# Patient Record
Sex: Male | Born: 1980 | Hispanic: Yes | Marital: Married | Smoking: Never smoker
Health system: Southern US, Community
[De-identification: ages and names within clinical notes are randomized; demographics above are authoritative.]

## PROBLEM LIST (undated history)

## (undated) ENCOUNTER — Emergency Department (HOSPITAL_COMMUNITY): Disposition: A | Discharge status: Home / Self Care | Payer: Self-pay

## (undated) DIAGNOSIS — N200 Calculus of kidney: Secondary | ICD-10-CM

---

## 2016-04-09 ENCOUNTER — Encounter (HOSPITAL_COMMUNITY): Payer: Self-pay | Admitting: Emergency Medicine

## 2016-04-09 ENCOUNTER — Emergency Department (HOSPITAL_COMMUNITY)
Admission: EM | Admit: 2016-04-09 | Discharge: 2016-04-09 | Disposition: A | Discharge status: Home / Self Care | Payer: Worker's Compensation | Attending: Emergency Medicine | Admitting: Emergency Medicine

## 2016-04-09 DIAGNOSIS — Y939 Activity, unspecified: Secondary | ICD-10-CM | POA: Insufficient documentation

## 2016-04-09 DIAGNOSIS — Y929 Unspecified place or not applicable: Secondary | ICD-10-CM | POA: Insufficient documentation

## 2016-04-09 DIAGNOSIS — Y990 Civilian activity done for income or pay: Secondary | ICD-10-CM | POA: Diagnosis not present

## 2016-04-09 DIAGNOSIS — W228XXA Striking against or struck by other objects, initial encounter: Secondary | ICD-10-CM | POA: Insufficient documentation

## 2016-04-09 DIAGNOSIS — S0990XA Unspecified injury of head, initial encounter: Secondary | ICD-10-CM | POA: Insufficient documentation

## 2016-04-09 MED ORDER — ACETAMINOPHEN 325 MG PO TABS
650.0000 mg | ORAL_TABLET | Freq: Once | ORAL | Status: AC
  Administered 2016-04-09: 650 mg via ORAL
  Filled 2016-04-09: qty 2

## 2016-04-09 NOTE — Discharge Instructions (Signed)
Read the information below.   You may have a mild concussion. I have provided information regarding concussion and management, please read.  You can take tylenol or motrin for pain relief.  Be sure to follow up with a primary care doctor, I have provided the contact information above.  You may return to the Emergency Department at any time for worsening condition or any new symptoms that concern you. Return to ED if you develop vomiting, changes in vision, confusion/agitation/lethargy, or any neurologic symptoms.

## 2016-04-09 NOTE — ED Provider Notes (Signed)
[facility]-EMERGENCY DEPT Provider Note   CSN: [id number] Arrival date & time: 04/09/16  1751   By signing my name below, I, Christopher Johnston, attest that this documentation has been prepared under the direction and in the presence of Christopher MeresAshley Meyer, PA-C. Electronically Signed: Christel MormonMatthew Johnston, Scribe. 04/09/2016. 7:53 PM.   History   Chief Complaint Chief Complaint  Patient presents with  . Head Injury    The history is provided by the patient. No language interpreter was used.   HPI Comments:  Christopher Johnston is a 35 y.o. male who presents to the Emergency Department s/p an injury to his head and face. Pt reports that he was lowering a 1916ft ladder when his co-worker let the ladder slip causing it to hit the pt on the head, slide down his face, and trap his arm on the steps. Pt states that he felt dizzy initially following the event, but the dizziness has resolved. Pt notes some moderate bruising on his L shoulder. Pt denies taking anticoagulates. No alleviating factors noted. Patient has not tried any treatment. Pt denies headache, lightheadedness, numbness, weakness, LOC, slurred speech, facial droop, neck pain, trouble swallowing, eye pain, changes in vision, chest pain, SOB, dysuria, hematuria, vomiting, and fever.   History reviewed. No pertinent past medical history.  There are no active problems to display for this patient.   History reviewed. No pertinent surgical history.     Home Medications    Prior to Admission medications   Not on File    Family History History reviewed. No pertinent family history.  Social History Social History  Substance Use Topics  . Smoking status: Never Smoker  . Smokeless tobacco: Never Used  . Alcohol use Yes     Comment: less than 1 week     Allergies   Review of patient's allergies indicates no known allergies.   Review of Systems Review of Systems  Constitutional: Negative for fever.  HENT: Negative for trouble swallowing.     Eyes: Negative for pain and visual disturbance.  Respiratory: Negative for shortness of breath.   Cardiovascular: Negative for chest pain.  Gastrointestinal: Negative for abdominal pain and vomiting.  Genitourinary: Negative for dysuria and hematuria.  Musculoskeletal: Negative for arthralgias, myalgias and neck pain.  Skin: Negative for rash.  Neurological: Positive for dizziness ( resolved) and headaches ( resolved). Negative for syncope, facial asymmetry, speech difficulty, weakness, light-headedness and numbness.     Physical Exam Updated Vital Signs BP 137/79 (BP Location: Right Arm)   Pulse 74   Temp 98.9 F (37.2 C) (Oral)   Resp 20   Ht 5\' 5"  (1.651 m)   Wt 102.1 kg   SpO2 99%   BMI 37.44 kg/m   Physical Exam  Constitutional: He appears well-developed and well-nourished. No distress.  HENT:  Head: Normocephalic and atraumatic. Head is without raccoon's eyes and without Battle's sign.  Right Ear: No hemotympanum.  Left Ear: No hemotympanum.  Mouth/Throat: Oropharynx is clear and moist. No oropharyngeal exudate.  Eyes: Conjunctivae and EOM are normal. Pupils are equal, round, and reactive to light. Right eye exhibits no discharge. Left eye exhibits no discharge. No scleral icterus.  Neck: Normal range of motion and phonation normal. Neck supple. No neck rigidity. Normal range of motion present.  Cardiovascular: Normal rate, regular rhythm, normal heart sounds and intact distal pulses.   No murmur heard. Pulmonary/Chest: Effort normal and breath sounds normal. No stridor. No respiratory distress. He has no wheezes. He has no rales.  Abdominal: Soft. Bowel sounds are normal. He exhibits no distension. There is no tenderness. There is no rigidity, no rebound, no guarding and no CVA tenderness.  Musculoskeletal: Normal range of motion.  Lymphadenopathy:    He has no cervical adenopathy.  Neurological: He is alert. He is not disoriented. Coordination and gait normal. GCS  eye subscore is 4. GCS verbal subscore is 5. GCS motor subscore is 6.  Mental Status:  Alert, thought content appropriate, able to give a coherent history. Speech fluent without evidence of aphasia. Able to follow 2 step commands without difficulty.  Cranial Nerves:  II:  Peripheral visual fields grossly normal, pupils equal, round, reactive to light III,IV, VI: ptosis not present, extra-ocular motions intact bilaterally  V,VII: smile symmetric, facial light touch sensation equal VIII: hearing grossly normal to voice  X: uvula elevates symmetrically  XI: bilateral shoulder shrug symmetric and strong XII: midline tongue extension without fassiculations Motor:  Normal tone. 5/5 in upper and lower extremities bilaterally including strong and equal grip strength and dorsiflexion/plantar flexion Sensory: light touch normal in all extremities. Cerebellar: normal finger-to-nose with bilateral upper extremities Gait: normal gait and balance CV: distal pulses palpable throughout   Skin: Skin is warm and dry. He is not diaphoretic.  Psychiatric: He has a normal mood and affect. His behavior is normal.  Nursing note and vitals reviewed.    ED Treatments / Results  DIAGNOSTIC STUDIES:  Oxygen Saturation is 99% on RA, normal by my interpretation.    COORDINATION OF CARE:  7:53 PM Discussed treatment plan with pt at bedside and pt agreed to plan.  Labs (all labs ordered are listed, but only abnormal results are displayed) Labs Reviewed - No data to display  EKG  EKG Interpretation None       Radiology No results found.  Procedures Procedures (including critical care time)  Medications Ordered in ED Medications  acetaminophen (TYLENOL) tablet 650 mg (650 mg Oral Given 04/09/16 2010)     Initial Impression / Assessment and Plan / ED Course  I have reviewed the triage vital signs and the nursing notes.  Pertinent labs & imaging results that were available during my care of the  patient were reviewed by me and considered in my medical decision making (see chart for details).  Clinical Course   Patient presents to ED complaint of head injury. Patient is afebrile and non-toxic appearing in NAD. VSS. Physical exam is re-assuring. No battle sign, raccoon eyes, or hemotympanum. No neurologic deficits. Based on [place] CT rule do not feel imaging is warranted at this time - low suspicion for skull fracture or closed head injury. Possible minor concussion. Discussed plan with patient. Symptomatic management to include cognitive rest and pain medicine. Strict return precautions discussed. Patient voiced understanding and is agreeable.     Final Clinical Impressions(s) / ED Diagnoses   Final diagnoses:  Minor head injury, initial encounter    New Prescriptions New Prescriptions   No medications on file  I personally performed the services described in this documentation, which was scribed in my presence. The recorded information has been reviewed and is accurate.     Lona Kettle, PA-C 04/09/16 2015    Doug Sou, MD 04/10/16 [phone]

## 2016-04-09 NOTE — ED Triage Notes (Signed)
Pt reports he was lowering a 2116ft ladder at work with a co-worker when the co-worker let part of the ladder slip. The ladder hit him in the head/face. Pt reports minor throbbing pain at this time 2/10.

## 2019-12-04 ENCOUNTER — Encounter (HOSPITAL_COMMUNITY): Payer: Self-pay | Admitting: *Deleted

## 2019-12-04 ENCOUNTER — Emergency Department (HOSPITAL_COMMUNITY): Payer: Self-pay

## 2019-12-04 ENCOUNTER — Other Ambulatory Visit: Payer: Self-pay

## 2019-12-04 ENCOUNTER — Emergency Department (HOSPITAL_COMMUNITY)
Admission: EM | Admit: 2019-12-04 | Discharge: 2019-12-05 | Disposition: A | Discharge status: Home / Self Care | Payer: Self-pay | Attending: Emergency Medicine | Admitting: Emergency Medicine

## 2019-12-04 DIAGNOSIS — N23 Unspecified renal colic: Secondary | ICD-10-CM | POA: Insufficient documentation

## 2019-12-04 HISTORY — DX: Calculus of kidney: N20.0

## 2019-12-04 LAB — BASIC METABOLIC PANEL
Anion gap: 9 (ref 5–15)
BUN: 16 mg/dL (ref 6–20)
CO2: 26 mmol/L (ref 22–32)
Calcium: 9.2 mg/dL (ref 8.9–10.3)
Chloride: 105 mmol/L (ref 98–111)
Creatinine, Ser: 1.12 mg/dL (ref 0.61–1.24)
GFR calc Af Amer: >60 mL/min (ref >60)
GFR calc non Af Amer: >60 mL/min (ref >60)
Glucose, Bld: 144 mg/dL — ABNORMAL HIGH (ref 70–99)
Potassium: 3.7 mmol/L (ref 3.5–5.1)
Sodium: 140 mmol/L (ref 135–145)

## 2019-12-04 LAB — CBC
HCT: 46.2 % (ref 39.0–52.0)
Hemoglobin: 15.6 g/dL (ref 13.0–17.0)
MCH: 28.9 pg (ref 26.0–34.0)
MCHC: 33.8 g/dL (ref 30.0–36.0)
MCV: 85.6 fL (ref 80.0–100.0)
Platelets: 301 10*3/uL (ref 150–400)
RBC: 5.4 MIL/uL (ref 4.22–5.81)
RDW: 13 % (ref 11.5–15.5)
WBC: 12.7 10*3/uL — ABNORMAL HIGH (ref 4.0–10.5)
nRBC: 0 % (ref 0.0–0.2)

## 2019-12-04 LAB — URINALYSIS, ROUTINE W REFLEX MICROSCOPIC
Bacteria, UA: NONE SEEN
Bilirubin Urine: NEGATIVE
Glucose, UA: NEGATIVE mg/dL
Ketones, ur: NEGATIVE mg/dL
Leukocytes,Ua: NEGATIVE
Nitrite: NEGATIVE
Protein, ur: 30 mg/dL — AB
RBC / HPF: >50 RBC/hpf — ABNORMAL HIGH (ref 0–5)
Specific Gravity, Urine: 1.02 (ref 1.005–1.030)
pH: 6 (ref 5.0–8.0)

## 2019-12-04 MED ORDER — ONDANSETRON HCL 4 MG/2ML IJ SOLN
4.0000 mg | Freq: Once | INTRAMUSCULAR | Status: AC
  Administered 2019-12-04: 23:00:00 4 mg via INTRAVENOUS
  Filled 2019-12-04: qty 2

## 2019-12-04 MED ORDER — SODIUM CHLORIDE 0.9 % IV BOLUS
1000.0000 mL | Freq: Once | INTRAVENOUS | Status: AC
  Administered 2019-12-04: 23:00:00 1000 mL via INTRAVENOUS

## 2019-12-04 MED ORDER — MORPHINE SULFATE (PF) 4 MG/ML IV SOLN
4.0000 mg | Freq: Once | INTRAVENOUS | Status: AC
  Administered 2019-12-04: 23:00:00 4 mg via INTRAVENOUS
  Filled 2019-12-04: qty 1

## 2019-12-04 NOTE — ED Triage Notes (Signed)
Left sided flank pain, vomiting, and blood in urine since around noon today. Hx of kidney stones, feels similar. Also, feels like he has to have a BM, but unable.

## 2019-12-04 NOTE — ED Provider Notes (Signed)
[facility] DEPT Provider Note   CSN: [id number] Arrival date & time: 12/04/19  2123     History Chief Complaint  Patient presents with  . Flank Pain   Christopher Johnston is a 39 y.o. male with a history of kidney stones who presents to the ED with complaints of flank pain that began around noon today. Patient states pain is located in the L flank, radiates into the L lower abdomen. Waxing/waning in severity, currently a 10/10 in severity without alleviating/aggravating factors. Has had associated nausea with 2 episodes of non bloody emesis as well as hematuria. Denies fever, chills, hematemesis, melena, hematochezia, dysuria, or testicular pain/swelling. Last BM was this afternoon and was normal, denies diarrhea, melena, or constipation despite triage note. He states this feels like prior kidney stones. Has not required prior surgical intervention for stones.   HPI     Past Medical History:  Diagnosis Date  . Kidney stones     There are no problems to display for this patient.   No past surgical history on file.     No family history on file.  Social History   Tobacco Use  . Smoking status: Never Smoker  . Smokeless tobacco: Never Used  Substance Use Topics  . Alcohol use: Yes    Comment: less than 1 week  . Drug use: No    Home Medications Prior to Admission medications   Not on File    Allergies    Patient has no known allergies.  Review of Systems   Review of Systems  Constitutional: Negative for chills and fever.  Respiratory: Negative for shortness of breath.   Cardiovascular: Negative for chest pain.  Gastrointestinal: Positive for abdominal pain, nausea and vomiting. Negative for anal bleeding, blood in stool, constipation and diarrhea.  Genitourinary: Positive for flank pain and hematuria. Negative for discharge, dysuria, scrotal swelling and testicular pain.  All other systems reviewed and are negative.   Physical  Exam Updated Vital Signs BP (!) 166/106 (BP Location: Right Arm)   Pulse 64   Temp 98.1 F (36.7 C) (Oral)   Resp 20   Ht 5\' 6"  (1.676 m)   Wt 101.6 kg   SpO2 99%   BMI 36.15 kg/m   Physical Exam Vitals and nursing note reviewed.  Constitutional:      General: He is in acute distress (mild, appears uncomfortable. ).     Appearance: He is well-developed. He is not toxic-appearing.  HENT:     Head: Normocephalic and atraumatic.  Eyes:     General:        Right eye: No discharge.        Left eye: No discharge.     Conjunctiva/sclera: Conjunctivae normal.  Cardiovascular:     Rate and Rhythm: Normal rate and regular rhythm.  Pulmonary:     Effort: Pulmonary effort is normal. No respiratory distress.     Breath sounds: Normal breath sounds. No wheezing, rhonchi or rales.  Abdominal:     General: There is no distension.     Palpations: Abdomen is soft.     Tenderness: There is abdominal tenderness (mild LLQ). There is left CVA tenderness. There is no right CVA tenderness, guarding or rebound.  Musculoskeletal:     Cervical back: Neck supple.  Skin:    General: Skin is warm and dry.     Findings: No rash.  Neurological:     Mental Status: He is alert.     Comments:  Clear speech.   Psychiatric:        Behavior: Behavior normal.    ED Results / Procedures / Treatments   Labs (all labs ordered are listed, but only abnormal results are displayed) Labs Reviewed  URINALYSIS, ROUTINE W REFLEX MICROSCOPIC - Abnormal; Notable for the following components:      Result Value   APPearance HAZY (*)    Hgb urine dipstick LARGE (*)    Protein, ur 30 (*)    RBC / HPF >50 (*)    All other components within normal limits  BASIC METABOLIC PANEL - Abnormal; Notable for the following components:   Glucose, Bld 144 (*)    All other components within normal limits  CBC - Abnormal; Notable for the following components:   WBC 12.7 (*)    All other components within normal limits     EKG None  Radiology US Renal  Result Date: 12/04/2019 CLINICAL DATA:  Left flank pain EXAM: RENAL / URINARY TRACT ULTRASOUND COMPLETE COMPARISON:  None. FINDINGS: Right Kidney: Renal measurements: 12.5 x 6.3 x 6.7 cm = volume: 273.7 mL . Echogenicity within normal limits. No mass or hydronephrosis visualized. Left Kidney: Renal measurements: 13 x 6 x 6.9 cm = volume: 282.6 mL. Cortical echogenicity normal. Mild dilatation of left renal pelvis without calyceal dilatation. Possible small stone measuring 5 mm in the mid pole. Bladder: Appears normal for degree of bladder distention. Other: None. IMPRESSION: 1. Mild left renal pelvis dilatation without frank hydronephrosis. Probable small stone in the left kidney. CT KUB follow-up as indicated. 2. Normal ultrasound appearance of the right kidney Electronically Signed   By: Jasmine Pang M.D.   On: 12/04/2019 23:15    Procedures Procedures (including critical care time)  Medications Ordered in ED Medications  sodium chloride 0.9 % bolus 1,000 mL (0 mLs Intravenous Stopped 12/04/19 2343)  ondansetron (ZOFRAN) injection 4 mg (4 mg Intravenous Given 12/04/19 2234)  morphine 4 MG/ML injection 4 mg (4 mg Intravenous Given 12/04/19 2234)    ED Course  I have reviewed the triage vital signs and the nursing notes.  Pertinent labs & imaging results that were available during my care of the patient were reviewed by me and considered in my medical decision making (see chart for details).    Christopher Johnston was evaluated in Emergency Department on 12/05/2019 for the symptoms described in the history of present illness. He/she was evaluated in the context of the global COVID-19 pandemic, which necessitated consideration that the patient might be at risk for infection with the SARS-CoV-2 virus that causes COVID-19. Institutional protocols and algorithms that pertain to the evaluation of patients at risk for COVID-19 are in a state of rapid change based on information  released by regulatory bodies including the CDC and federal and state organizations. These policies and algorithms were followed during the patient's care in the ED.  MDM Rules/Calculators/A&P                     Patient presents to the ED with complaints of L flank pain. Nontoxic, vitals WNL with the exception of elevated BP, low suspicion for HTN emergency. L CVA & LLQ abdominal tenderness noted. No peritoneal signs.   DDx: Nephrolithiasis, pyelonephritis, diverticulitis, constipation, obstruction, perf, pancreatitis, MSK, viral illness.   Additional history obtained:  Additional history obtained from patient's wife at bedside- confirms above. Previous records obtained and reviewed.   Lab Tests:  I Ordered, reviewed, and interpreted labs, which included:  CBC: Mild leukocytosis @ 12.7. No anemia.  BMP: Mild hyperglycemia. Renal function WNL. Electrolytes WNL.  Urinalysis: Hematuria, no UTI.  Imaging Studies ordered:  I ordered imaging studies which included renal ultrasound, I independently visualized and interpreted imaging which showed 1. Mild left renal pelvis dilatation without frank hydronephrosis. Probable small stone in the left kidney. CT KUB follow-up as indicated. 2. Normal ultrasound appearance of the right kidney ---> clinically suspect ureteral stone, patient w/ history of same and states this feels similar.   ED Course:  Patient appears uncomfortable on initial assessment. Morphine, zofran, & fluids ordered.   23:12: RE-EVAL: Patient resting comfortably, states pain is resolved at this time.   00:00: RE-EVAL: Patient remains pain free, tolerating PO, feels ready to go home   Suspect kidney stone as underlying etiology, he has not required surgical intervention with prior stones, renal function preserved, no infection on UA, pain controlled, do not feel emergent CT imaging is necessary at this time for further assessment. Repeat abdominal exam without peritoneal signs. Will  discharge home with symptomatic care and urology follow up. I discussed results, treatment plan, need for follow-up, and return precautions with the patient & his wife at bedside. Provided opportunity for questions, patient & is wife confirmed understanding and are in agreement with plan.   Portions of this note were generated with Scientist, clinical (histocompatibility and immunogenetics). Dictation errors may occur despite best attempts at proofreading.  Final Clinical Impression(s) / ED Diagnoses Final diagnoses:  Ureteral colic    Rx / DC Orders ED Discharge Orders         Ordered    ibuprofen (ADVIL) 600 MG tablet  Every 8 hours PRN     12/05/19 0005    oxyCODONE-acetaminophen (PERCOCET/ROXICET) 5-325 MG tablet  Every 6 hours PRN     12/05/19 0005    ondansetron (ZOFRAN ODT) 4 MG disintegrating tablet  Every 8 hours PRN     12/05/19 0005    tamsulosin (FLOMAX) 0.4 MG CAPS capsule  Daily after supper     12/05/19 0005           Christopher Johnston, Pleas Koch, PA-C 12/05/19 0008    Maia Plan, MD 12/05/19 1252

## 2019-12-05 MED ORDER — TAMSULOSIN HCL 0.4 MG PO CAPS: 0.4000 mg | ORAL_CAPSULE | Freq: Every day | ORAL | 0 refills | Status: AC

## 2019-12-05 MED ORDER — IBUPROFEN 600 MG PO TABS: 600.0000 mg | ORAL_TABLET | Freq: Three times a day (TID) | ORAL | 0 refills | Status: AC | PRN

## 2019-12-05 MED ORDER — OXYCODONE-ACETAMINOPHEN 5-325 MG PO TABS: 1.0000 | ORAL_TABLET | Freq: Four times a day (QID) | ORAL | 0 refills | Status: AC | PRN

## 2019-12-05 MED ORDER — ONDANSETRON 4 MG PO TBDP: 4.0000 mg | ORAL_TABLET | Freq: Three times a day (TID) | ORAL | 0 refills | Status: AC | PRN

## 2019-12-05 NOTE — Discharge Instructions (Signed)
You were seen in the emergency department for back/abdominal pain. We suspect that your symptoms are related to a kidney stone. We are sending you home with multiple medications to assist with passing the stone:   -Flomax-this is a medication to help pass the stone, it allows urine to exit the body more freely.  Please take this once daily with a meal.  -Ibuprofen 600 mg-this is a medication that will help with pain as well as passing the stone.  Please take this every 8 hours.  Take this with food as it can cause stomach upset and at worst stomach bleeding.  Do not take other NSAIDs such as Motrin, Aleve, Advil, Mobic, or Naproxen with this medicine as they are similar and would propagate any potential side effects.   -Percocet-this is a narcotic/controlled substance medication that has potential addicting qualities.  We recommend that you take 1-2 tablets every 6 hours as needed for severe pain.  Do not drive or operate heavy machinery when taking this medicine as it can be sedating. Do not drink alcohol or take other sedating medications when taking this medicine for safety reasons.  Keep this out of reach of small children.  Please be aware this medicine has Tylenol in it (325 mg/tab) do not exceed the maximum dose of Tylenol in a day per over the counter recommendations should you decide to supplement with Tylenol over the counter.   -Zofran-this is an antinausea medication, you may take this every 8 hours as needed for nausea and vomiting, please allow the tablet to dissolve underneath of your tongue.   We have prescribed you new medication(s) today. Discuss the medications prescribed today with your pharmacist as they can have adverse effects and interactions with your other medicines including over the counter and prescribed medications. Seek medical evaluation if you start to experience new or abnormal symptoms after taking one of these medicines, seek care immediately if you start to experience  difficulty breathing, feeling of your throat closing, facial swelling, or rash as these could be indications of a more serious allergic reaction  Please follow-up with the urology group provided in your discharge instructions within 3 to 5 days.  Return to the ER for new or worsening symptoms including but not limited to worsening pain not controlled by these medicines, inability to keep fluids down, fever, or any other concerns that you may have.

## 2020-09-05 IMAGING — US US RENAL
2 series · 14 of 25 positions shown · non-contrast
Comparison: None.

CLINICAL DATA: Left flank pain

EXAM:
RENAL / URINARY TRACT ULTRASOUND COMPLETE

[Series 1: us renal · 13 of 41 slices shown (1 of 2)]
[im 1/41]
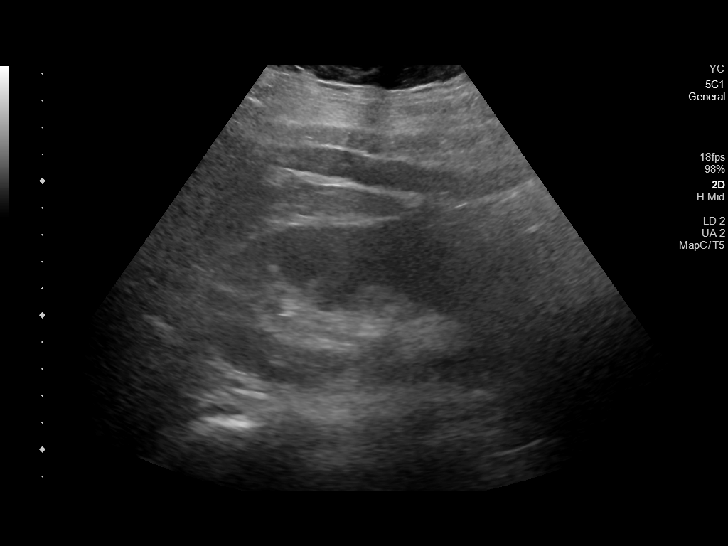
[im 4/41]
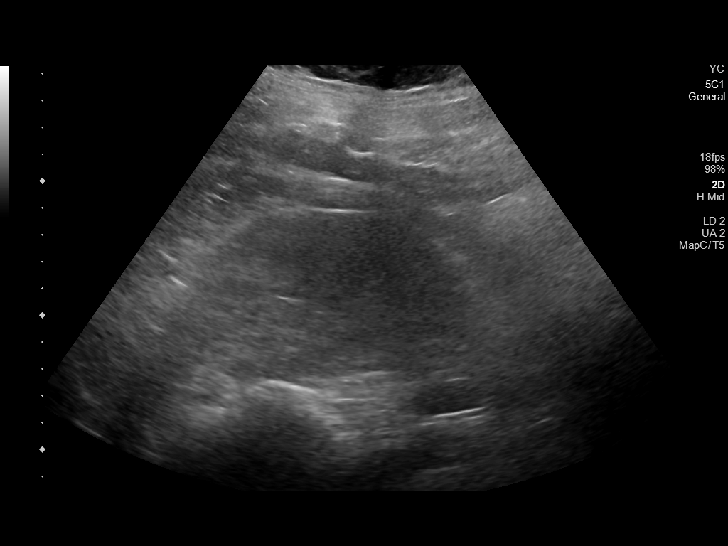
[im 7/41]
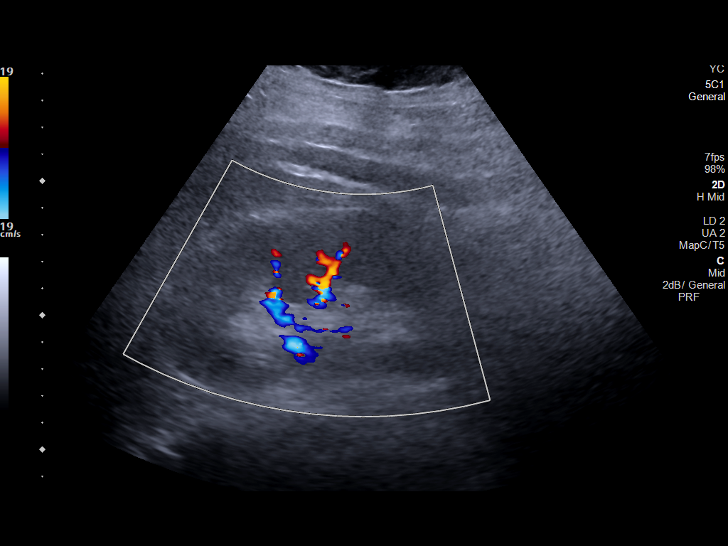
[im 11/41]
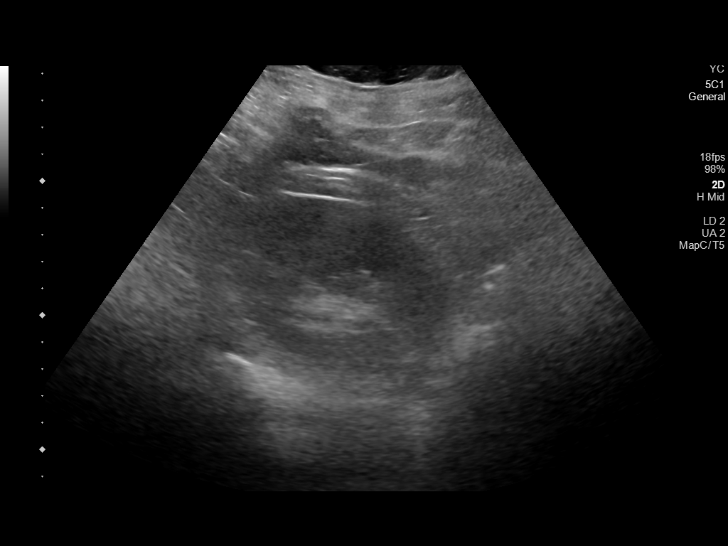
[im 14/41]
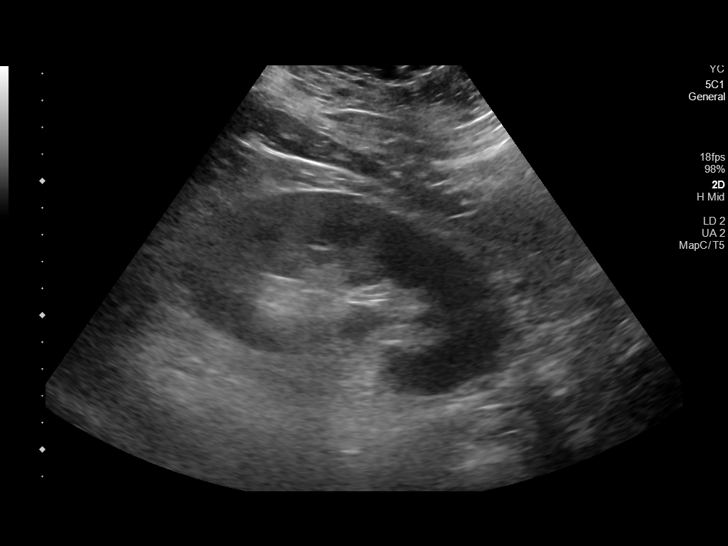
[im 16/41]
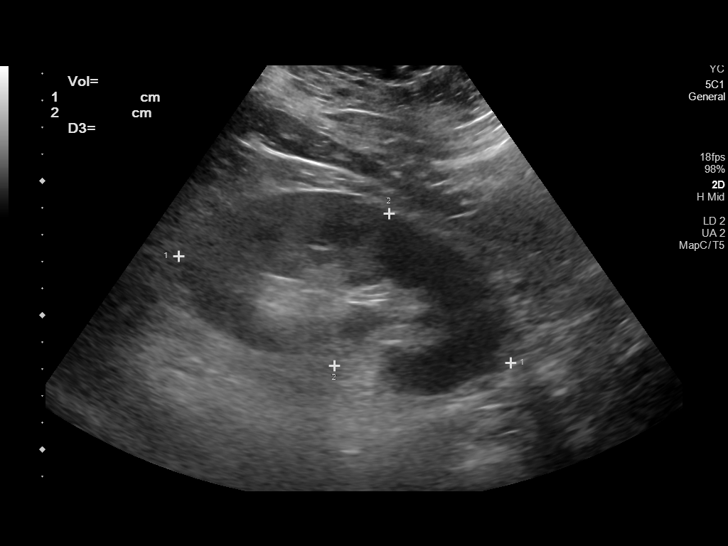
[im 20/41]
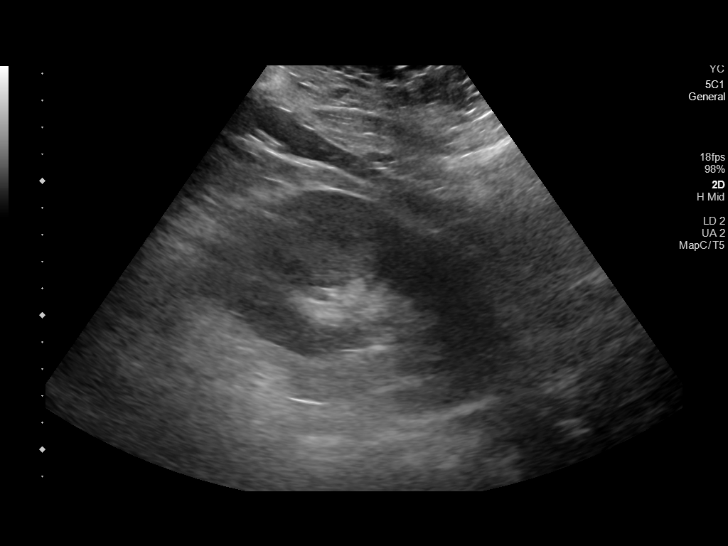
[im 23/41]
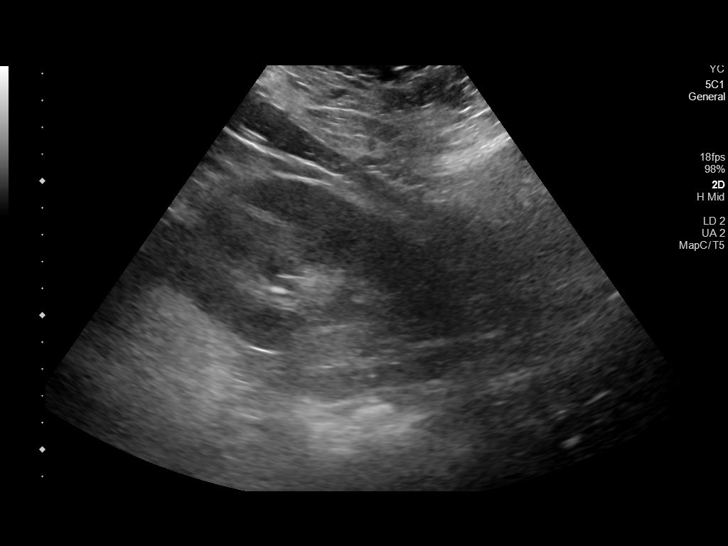
[im 27/41]
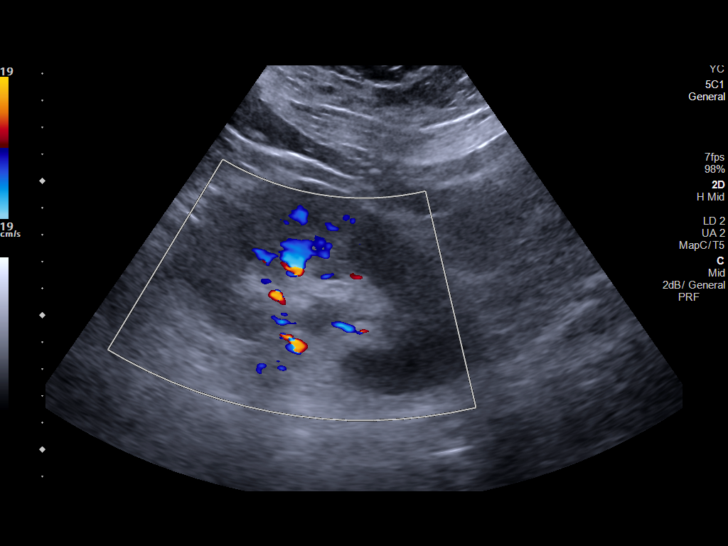
[im 28/41]
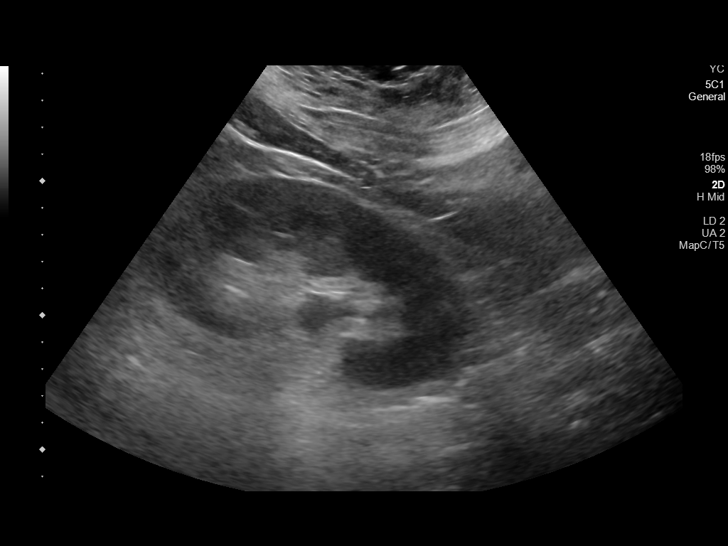
[im 32/41]
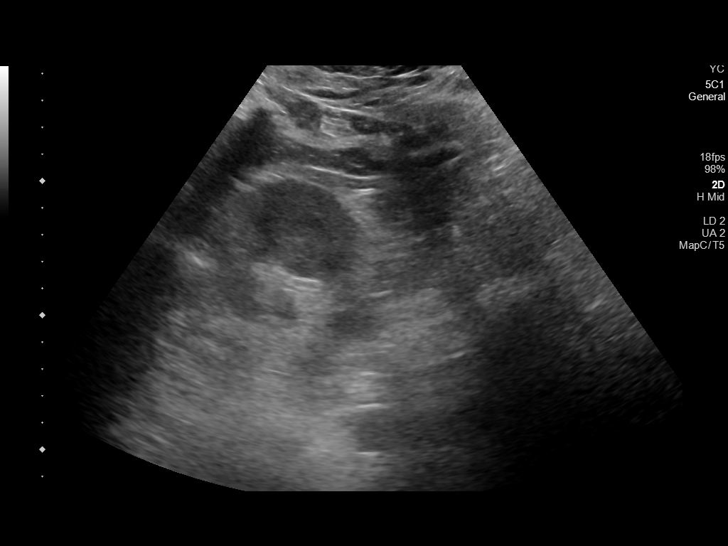
[im 35/41]
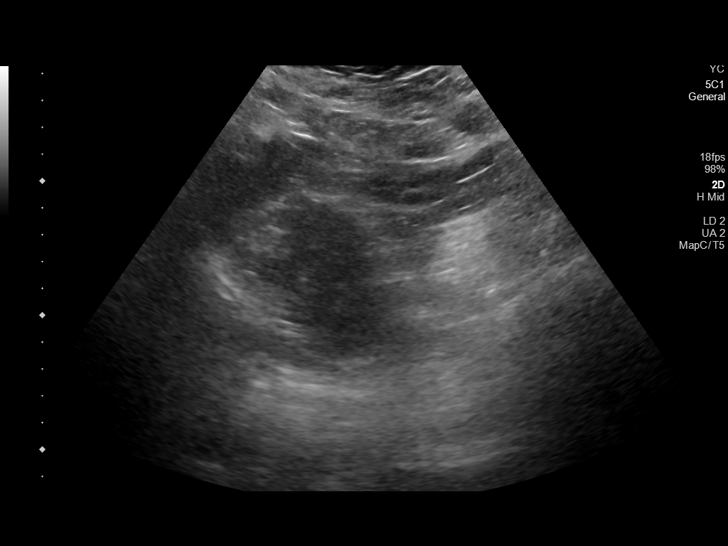
[im 39/41]
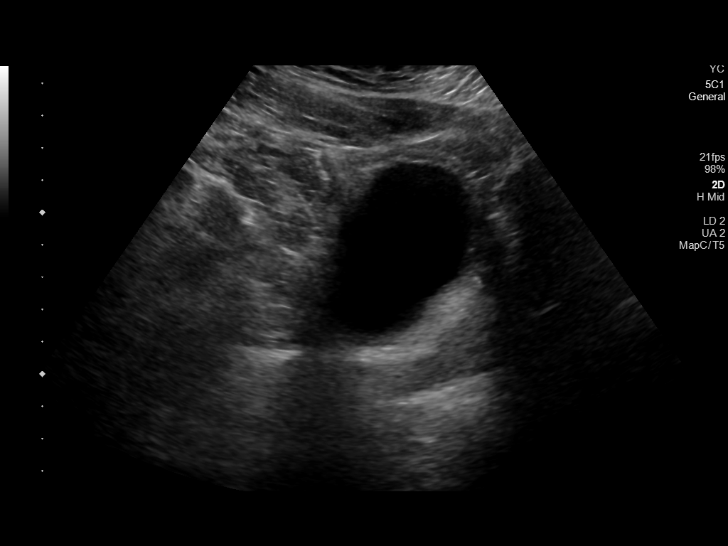

[Series 2: us renal · 1 of 2 slices shown (2 of 2)]
[im 1/2]
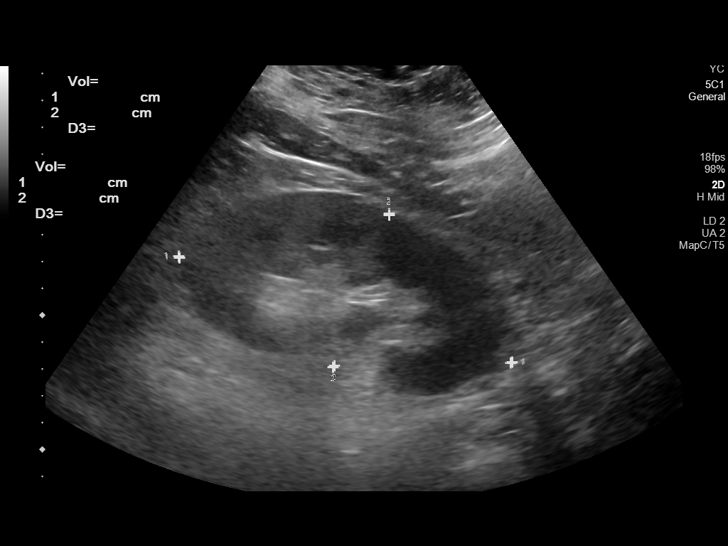

[14 of 25 positions shown; findings below may reference images not displayed]

FINDINGS: Right Kidney:

Renal measurements: 12.5 x 6.3 x 6.7 cm = volume: 273.7 mL .
Echogenicity within normal limits. No mass or hydronephrosis
visualized.

Left Kidney:

Renal measurements: 13 x 6 x 6.9 cm = volume: 282.6 mL. Cortical
echogenicity normal. Mild dilatation of left renal pelvis without
calyceal dilatation. Possible small stone measuring 5 mm in the mid
pole.

Bladder:

Appears normal for degree of bladder distention.

Other:

None.
IMPRESSION: 1. Mild left renal pelvis dilatation without frank hydronephrosis.
Probable small stone in the left kidney. CT KUB follow-up as
indicated.
2. Normal ultrasound appearance of the right kidney

## 2023-04-15 ENCOUNTER — Emergency Department (HOSPITAL_COMMUNITY)
Admission: EM | Admit: 2023-04-15 | Discharge: 2023-04-16 | Disposition: A | Discharge status: Home / Self Care | Payer: Self-pay | Attending: Emergency Medicine | Admitting: Emergency Medicine

## 2023-04-15 ENCOUNTER — Other Ambulatory Visit: Payer: Self-pay

## 2023-04-15 DIAGNOSIS — R109 Unspecified abdominal pain: Secondary | ICD-10-CM | POA: Insufficient documentation

## 2023-04-15 NOTE — ED Triage Notes (Signed)
The pt has rt flank pain   for 3-4 hours ago  he feels like he cannot empty his bladder  hx of kidney stones

## 2023-04-16 ENCOUNTER — Encounter (HOSPITAL_COMMUNITY): Payer: Self-pay | Admitting: *Deleted

## 2023-04-16 ENCOUNTER — Other Ambulatory Visit: Payer: Self-pay

## 2023-04-16 LAB — COMPREHENSIVE METABOLIC PANEL
ALT: 42 U/L (ref 0–44)
AST: 31 U/L (ref 15–41)
Albumin: 4.1 g/dL (ref 3.5–5.0)
Alkaline Phosphatase: 73 U/L (ref 38–126)
Anion gap: 10 (ref 5–15)
BUN: 16 mg/dL (ref 6–20)
CO2: 25 mmol/L (ref 22–32)
Calcium: 8.8 mg/dL — ABNORMAL LOW (ref 8.9–10.3)
Chloride: 100 mmol/L (ref 98–111)
Creatinine, Ser: 1.04 mg/dL (ref 0.61–1.24)
GFR, Estimated: >60 mL/min (ref >60)
Glucose, Bld: 176 mg/dL — ABNORMAL HIGH (ref 70–99)
Potassium: 3.5 mmol/L (ref 3.5–5.1)
Sodium: 135 mmol/L (ref 135–145)
Total Bilirubin: 0.5 mg/dL (ref 0.3–1.2)
Total Protein: 7.1 g/dL (ref 6.5–8.1)

## 2023-04-16 LAB — CBC
HCT: 42.8 % (ref 39.0–52.0)
Hemoglobin: 14.7 g/dL (ref 13.0–17.0)
MCH: 28.7 pg (ref 26.0–34.0)
MCHC: 34.3 g/dL (ref 30.0–36.0)
MCV: 83.6 fL (ref 80.0–100.0)
Platelets: 296 10*3/uL (ref 150–400)
RBC: 5.12 MIL/uL (ref 4.22–5.81)
RDW: 12.7 % (ref 11.5–15.5)
WBC: 9.3 10*3/uL (ref 4.0–10.5)
nRBC: 0 % (ref 0.0–0.2)

## 2023-04-16 LAB — URINALYSIS, ROUTINE W REFLEX MICROSCOPIC
Bilirubin Urine: NEGATIVE
Glucose, UA: NEGATIVE mg/dL
Ketones, ur: NEGATIVE mg/dL
Leukocytes,Ua: NEGATIVE
Nitrite: NEGATIVE
Protein, ur: 100 mg/dL — AB
Specific Gravity, Urine: >1.03 — ABNORMAL HIGH (ref 1.005–1.030)
pH: 6 (ref 5.0–8.0)

## 2023-04-16 LAB — URINALYSIS, MICROSCOPIC (REFLEX)
Bacteria, UA: NONE SEEN
RBC / HPF: >50 RBC/hpf (ref 0–5)

## 2023-04-16 LAB — LIPASE, BLOOD: Lipase: 33 U/L (ref 11–51)

## 2023-04-16 NOTE — ED Triage Notes (Signed)
The pt reports that after he voided his pain went away for now  he was getting a urine speciman for [place]

## 2023-04-16 NOTE — Discharge Instructions (Signed)
You were seen in the ER today for your flank pain.  Your symptoms are consistent with a kidney stone.  It appears he may have passed it.  Your blood work and urine studies are very reassuring.  Please continue to increase hydration and monitor your symptoms.  Return to the ER if you develop any new severe symptoms.

## 2023-04-16 NOTE — ED Provider Notes (Signed)
[place] [facility]ERGENCY DEPARTMENT AT [facility] Provider Note   CSN: [id number] Arrival date & time: 04/15/23  2342     History  Chief Complaint  Patient presents with   Flank Pain    Christopher Johnston is a 42 y.o. male with history of 2 episodes of nephrolithiasis in the past which he passed spontaneously, did not require any intervention for relief.  He presents today with approximately 4 hours of right-sided flank pain at time of arrival, associated urinary hesitancy and sensation that he cannot empty his bladder.  Dark-colored urine today.  He does state that after his most recent trip to the restroom to urinate he has had complete resolution of his symptoms.  He feels that he may have passed a stone at that time.  Resting comfortably in the bed pain-free at time of my evaluation.  No other medical diagnoses or medications he takes daily.  HPI     Home Medications Prior to Admission medications   Medication Sig Start Date End Date Taking? Authorizing Provider  ibuprofen (ADVIL) 600 MG tablet Take 1 tablet (600 mg total) by mouth every 8 (eight) hours as needed for mild pain or moderate pain. 12/05/19   Petrucelli, Samantha R, PA-C  ondansetron (ZOFRAN ODT) 4 MG disintegrating tablet Take 1 tablet (4 mg total) by mouth every 8 (eight) hours as needed for nausea or vomiting. 12/05/19   Petrucelli, Samantha R, PA-C  oxyCODONE-acetaminophen (PERCOCET/ROXICET) 5-325 MG tablet Take 1-2 tablets by mouth every 6 (six) hours as needed for severe pain. 12/05/19   Petrucelli, Samantha R, PA-C  tamsulosin (FLOMAX) 0.4 MG CAPS capsule Take 1 capsule (0.4 mg total) by mouth daily after supper. 12/05/19   Petrucelli, Pleas Koch, PA-C      Allergies    Patient has no known allergies.    Review of Systems   Review of Systems  Genitourinary:  Positive for difficulty urinating, flank pain and urgency. Negative for hematuria.    Physical Exam Updated Vital Signs BP (!) 146/90 (BP Location: Right  Arm)   Pulse 85   Temp 98.1 F (36.7 C)   Resp 20   Ht 5\' 6"  (1.676 m)   Wt 102.1 kg   SpO2 96%   BMI 36.33 kg/m  Physical Exam Vitals and nursing note reviewed.  Constitutional:      Appearance: He is obese. He is not ill-appearing or toxic-appearing.  HENT:     Head: Normocephalic and atraumatic.     Mouth/Throat:     Mouth: Mucous membranes are moist.     Pharynx: No oropharyngeal exudate or posterior oropharyngeal erythema.  Eyes:     General:        Right eye: No discharge.        Left eye: No discharge.     Conjunctiva/sclera: Conjunctivae normal.  Cardiovascular:     Rate and Rhythm: Normal rate and regular rhythm.     Pulses: Normal pulses.     Heart sounds: No murmur heard. Pulmonary:     Effort: Pulmonary effort is normal. No respiratory distress.     Breath sounds: Normal breath sounds. No wheezing or rales.  Abdominal:     General: Bowel sounds are normal. There is no distension.     Palpations: Abdomen is soft.     Tenderness: There is no abdominal tenderness. There is no right CVA tenderness, left CVA tenderness, guarding or rebound.  Musculoskeletal:        General: No deformity.  Cervical back: Neck supple.     Right lower leg: No edema.     Left lower leg: No edema.  Skin:    General: Skin is warm and dry.     Capillary Refill: Capillary refill takes less than 2 seconds.  Neurological:     General: No focal deficit present.     Mental Status: He is alert and oriented to person, place, and time. Mental status is at baseline.  Psychiatric:        Mood and Affect: Mood normal.     ED Results / Procedures / Treatments   Labs (all labs ordered are listed, but only abnormal results are displayed) Labs Reviewed  COMPREHENSIVE METABOLIC PANEL - Abnormal; Notable for the following components:      Result Value   Glucose, Bld 176 (*)    Calcium 8.8 (*)    All other components within normal limits  URINALYSIS, ROUTINE W REFLEX MICROSCOPIC -  Abnormal; Notable for the following components:   APPearance HAZY (*)    Specific Gravity, Urine >1.030 (*)    Hgb urine dipstick LARGE (*)    Protein, ur 100 (*)    All other components within normal limits  LIPASE, BLOOD  CBC  URINALYSIS, MICROSCOPIC (REFLEX)    EKG None  Radiology No results found.  Procedures Procedures    Medications Ordered in ED Medications - No data to display  ED Course/ Medical Decision Making/ A&P                                 Medical Decision Making 42 y/o male with hx of nephrolithiasis who presents with concern for R flank pain that started this evening.   HTN on intake, VS otherwise normal, cardiopulmonary exam is normal, abdominal exam is benign.   The differential diagnosis of emergent flank pain includes, but is not limited to: Nephrolithiasis/ Renal Colic, Pyelonephritis, Abdominal aortic aneurysm, Aortic dissection, Renal artery embolism, Renal vein thrombosis, Renal infarction, Renal hemorrhage, Mesenteric ischemia, Bladder tumor, Cystitis, Biliary colic, Pancreatitis, Perforated peptic ulcer,  Appendicitis, Inguinal Hernia, Diverticulitis, Bowel obstruction. Shingles, Lower lobe pneumonia, Retroperitoneal hematoma/abscess/tumor, Epidural abscess, Epidural hematoma. In males, it is important to consider (Testicular torsion, Epididymitis, STD)    Amount and/or Complexity of Data Reviewed Labs:     Details: CBC without leukocytosis or anemia, CMP with normal renal function without metabolic derangement.  UA with large hemoglobin, greater than 50 RBCs, proteinuria without evidence of infection.     Clinical picture most consistent with nephro/ureterolithiasis now passed.  Clinical concern for obstructive uropathy, or other emergent underlying etiology that would warrant further ED workup or inpatient management is exceedingly low.  No indication for CT renal study this time given resolution of patient's symptoms reassuring  workup.  Recommend increase hydration, will provide education regarding dietary changes to minimize production of renal stones.  Patient to follow-up in the outpatient setting.  [name]  voiced understanding of his medical evaluation and treatment plan. Each of their questions answered to their expressed satisfaction.  Return precautions were given.  Patient is well-appearing, stable, and was discharged in good condition.  This chart was dictated using voice recognition software, Dragon. Despite the best efforts of this provider to proofread and correct errors, errors may still occur which can change documentation meaning.  Final Clinical Impression(s) / ED Diagnoses Final diagnoses:  Flank pain    Rx / DC Orders ED Discharge  Orders     None         Sherrilee Gilles 04/16/23 1914    Tilden Fossa, MD 04/16/23 [phone]
